# Patient Record
Sex: Male | Born: 1940 | Race: White | Hispanic: No | Marital: Married
Health system: Southern US, Community
[De-identification: ages and names within clinical notes are randomized; demographics above are authoritative.]

## PROBLEM LIST (undated history)

## (undated) DIAGNOSIS — E063 Autoimmune thyroiditis: Secondary | ICD-10-CM

## (undated) DIAGNOSIS — E119 Type 2 diabetes mellitus without complications: Secondary | ICD-10-CM

## (undated) DIAGNOSIS — N400 Enlarged prostate without lower urinary tract symptoms: Secondary | ICD-10-CM

## (undated) HISTORY — PX: CORONARY ANGIOPLASTY WITH STENT PLACEMENT: SHX49

---

## 2017-09-16 IMAGING — CT CT ABDOMEN AND PELVIS WITHOUT CONTRAST
2 of 3 series · 15 of 46 positions shown, 17 images · non-contrast
Comparison: Reportedly recent normal abdominal ultrasound at [facility]

CT ABDOMEN AND PELVIS WITHOUT CONTRAST, 09/16/2017 [DATE]: 
CLINICAL INDICATION:  76-year-old male with elevated liver function tests, 
abnormal blood work, chest pain evaluated in the emergency room, some 
complaining of upper abdominal pain, previous cholecystectomy, umbilical 
hernia 
repair, hydrocele repair, cardiac stent, bilateral inguinal hernia repair 
A search for DICOM formatted images was conducted for prior CT imaging studies 
completed at a non-affiliated media free facility.
TECHNIQUE: The abdomen and pelvis were scanned from lung bases through the 
pubic rami without contrast on a high-resolution CT scanner using dose 
reduction 
techniques..  Routine MPR reconstructions were performed. 
Patient does have a shellfish allergy, no contrast was utilized

[Series 2: abd/pel ax wo · axial · 0.69mm/px · z∈[-615,-210]mm · 12 of 157 slices shown, 14 images]
[im 11/157  soft-tissue]
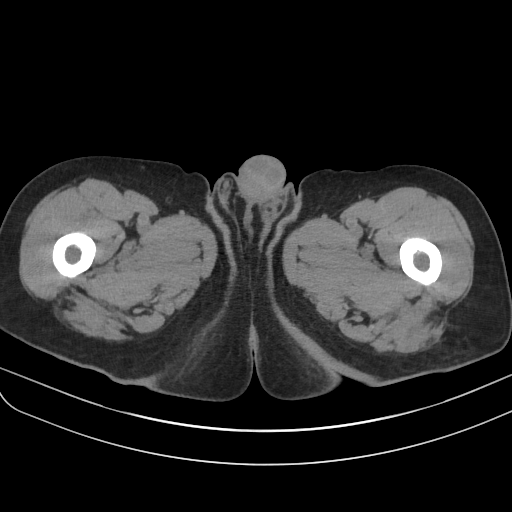
[im 11/157  bone]
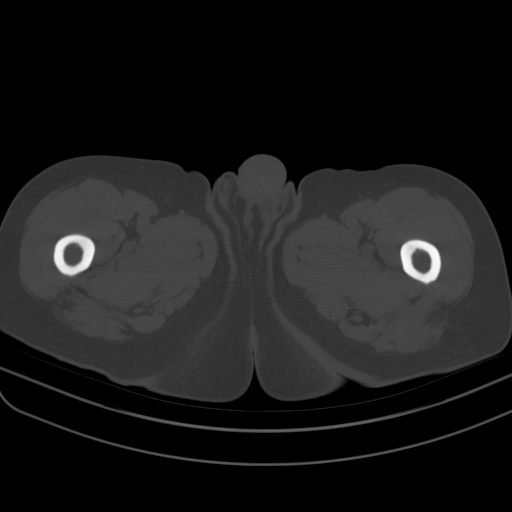
[im 21/157  soft-tissue]
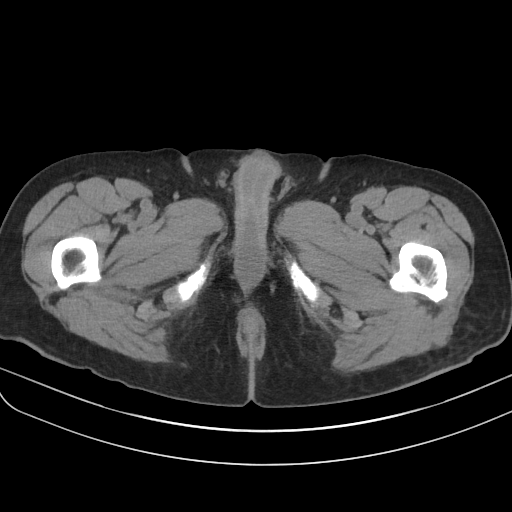
[im 36/157  soft-tissue]
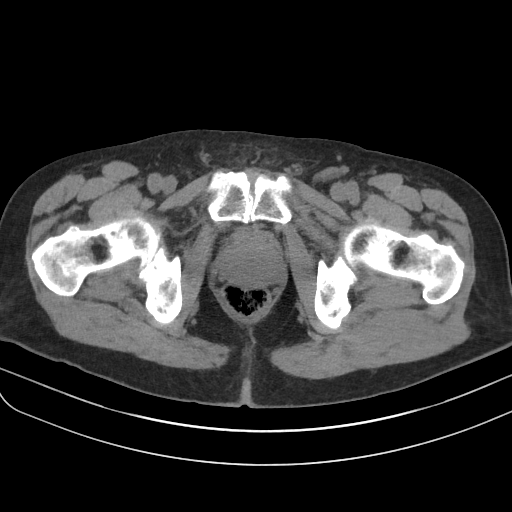
[im 46/157  soft-tissue]
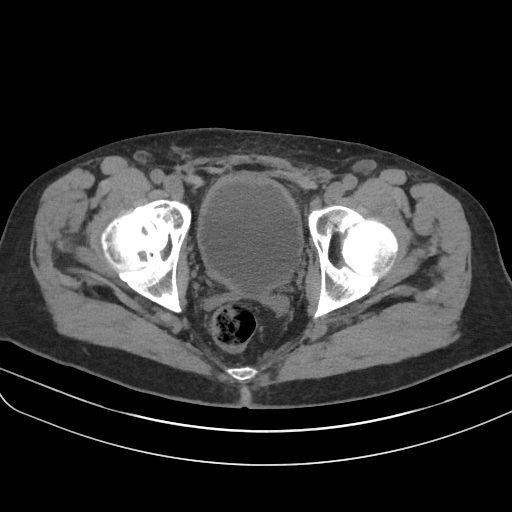
[im 61/157  soft-tissue]
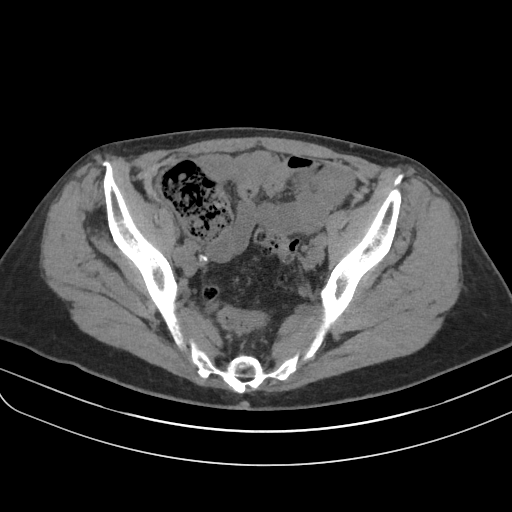
[im 71/157  soft-tissue]
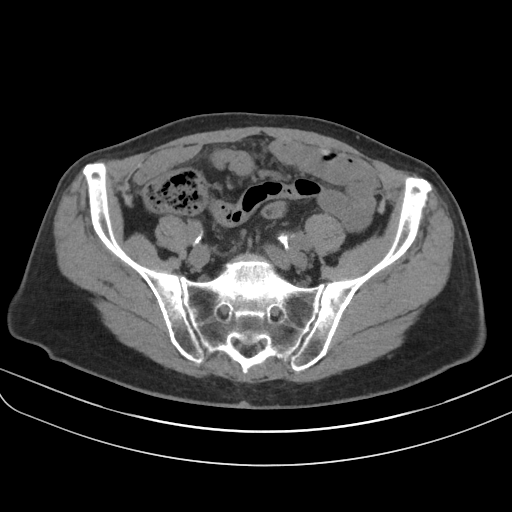
[im 86/157  soft-tissue]
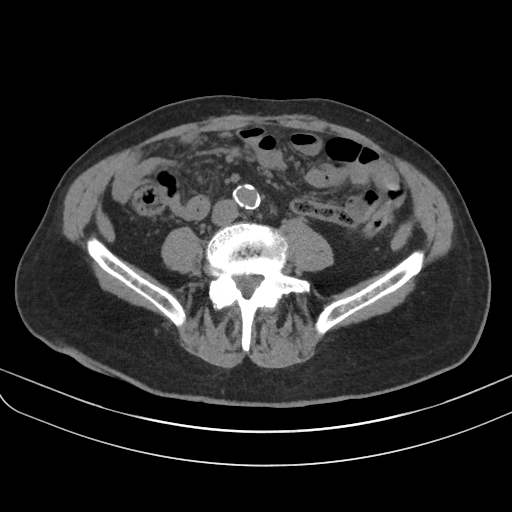
[im 96/157  soft-tissue]
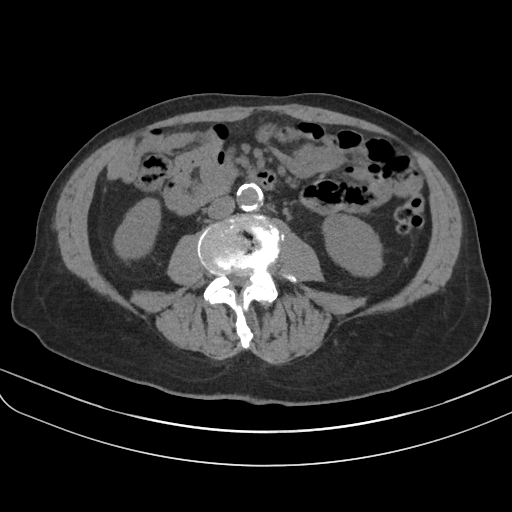
[im 111/157  soft-tissue]
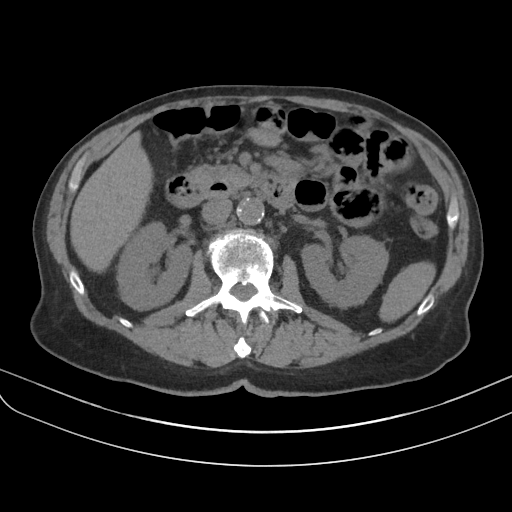
[im 111/157  bone]
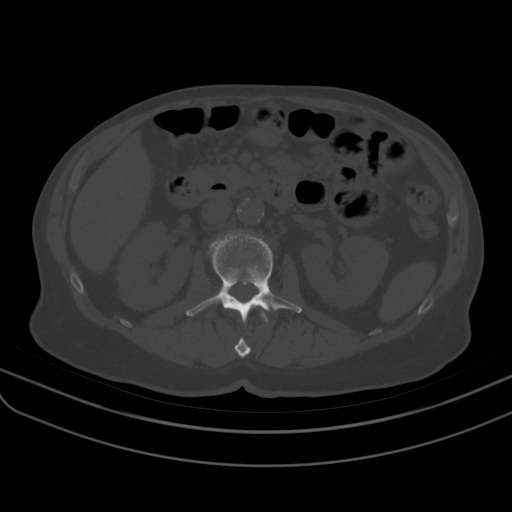
[im 121/157  soft-tissue]
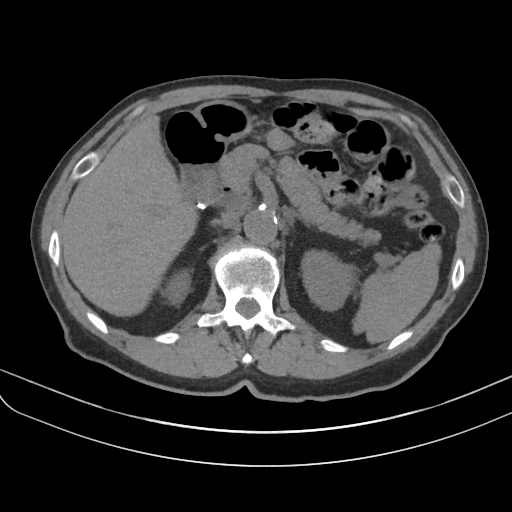
[im 136/157  soft-tissue]
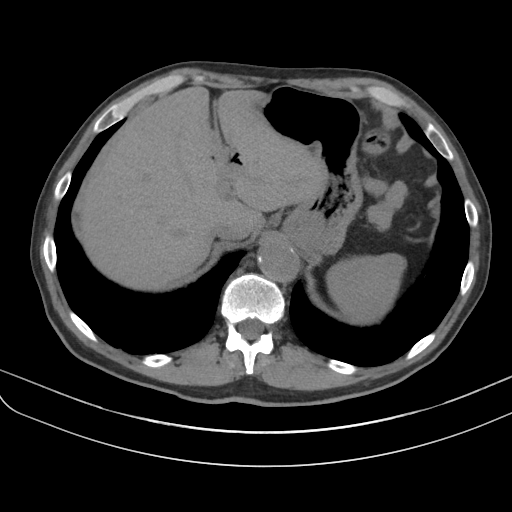
[im 146/157  soft-tissue]
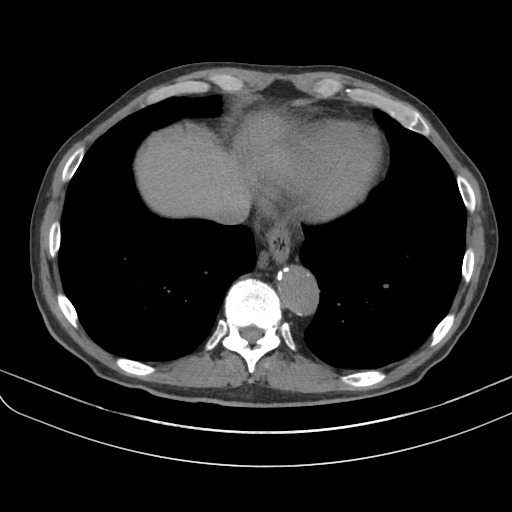

[Series 3: abd/pel cor wo · coronal · 0.71mm/px · 3 of 111 slices shown]
[im 37/111  soft-tissue]
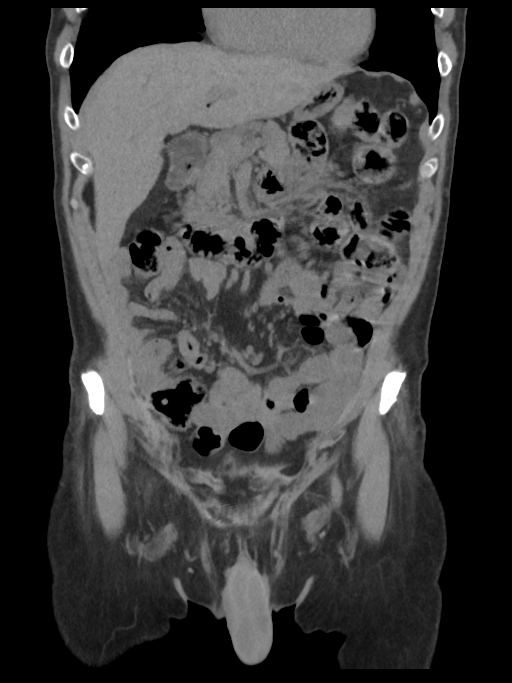
[im 49/111  soft-tissue]
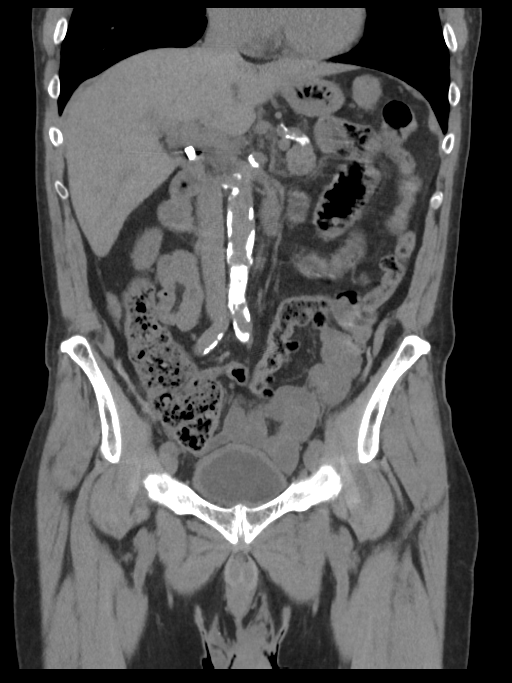
[im 62/111  soft-tissue]
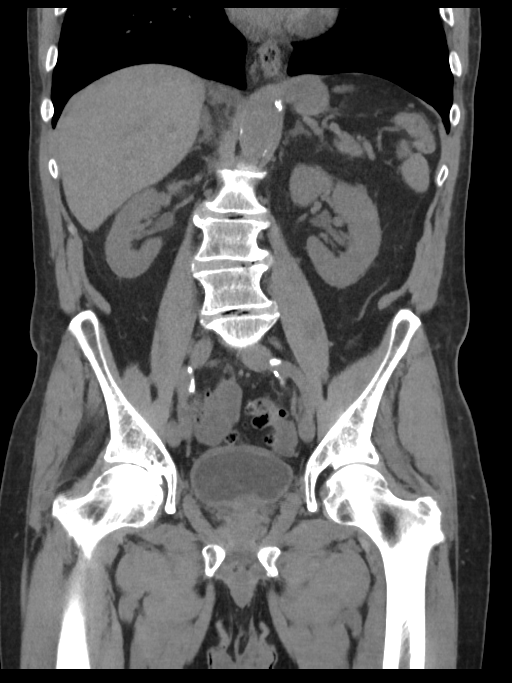

[15 of 46 positions shown; findings below may reference images not displayed]

FINDINGS: There is mild bronchial wall thickening in the lower lobes. No 
infiltrate or nodule. 
Small hiatal hernia. 
Previous cholecystectomy with intrahepatic biliary air primarily in the left 
lobe. 
Posterior segment right lobe liver subcapsular 2.2 x 1.9 cm hypodensity axial 
series 2 image 19 incompletely characterized. 
Liver contour is unremarkable. Liver is not enlarged. Density is normal. 
Normal appearance of the unenhanced spleen, pancreas, bilateral adrenal 
glands, 
and bilateral kidneys. 
Aorta is heavily atherosclerotic but not aneurysmal. 
Stomach is moderately distended with air. 
Small bowel pattern is nonobstructive. 
Moderate stool in the cecum. 
Urinary bladder is thick-walled. Prostate is enlarged and elevates the bladder 
base, measuring at least 5.1 cm craniocaudally. 
There is diffuse stool in the rectosigmoid with rectosigmoid diverticulosis, 
no 
definite CT evidence for diverticulitis. 
Small retroperitoneal lymph nodes are present. 
There are also small inguinal lymph nodes. 
Advanced right hip osteoarthritis with collar osteophyte formation. 
Dextroscoliosis lumbosacral spine. Multilevel vacuum discs. 
Retrolisthesis L3 on L4. No compression fracture. 
Mildly prominent cisterna chyli right T12.
IMPRESSION: Exam limited due to lack of IV contrast. Patient has shellfish allergy. 
Surgically absent gallbladder. Intrahepatic and intraductal biliary air. 
Distal 
ductal debris versus collapsed mucosa. 
Normal size and density of the liver. 
t lobe liver subcapsular dome and 2.2 x 1.9 cm hypodense lesion incompletely 
characterized. Recommend either premedicating for contrast allergy or 
performing 
MRI liver. 
Atherosclerosis and degenerative changes. 
Thick-walled urinary bladder with enlarged prostate. 
RADIATION DOSE REDUCTION: All CT scans are performed using radiation dose 
reduction techniques, when applicable.  Technical factors are evaluated and 
adjusted to ensure appropriate moderation of exposure.  Automated dose 
management technology is applied to adjust the radiation doses to minimize 
exposure while achieving diagnostic quality images.

## 2019-09-19 IMAGING — MR MRI ABDOMEN W/WO CONTRAST WITH MRCP
17 of 24 series · 27 of 48 positions shown · non-contrast
Comparison: CT abdomen and pelvis 09/16/2017 greater than 12 months ago.

MRI ABDOMEN W/WO CONTRAST WITH MRCP, 09/19/2019 [DATE]: 
CLINICAL INDICATION:  Unexplained abdominal pain and nausea. Looking for 
potential choledocholithiasis.
TECHNIQUE: Multiplanar, multiecho position MR images of the abdomen were 
performed with and without intravenous contrast enhancement.  7.5 cc Gadovist 
injected at a rate of 1.5 cc/s intravenously.

[Series 201: survey bh navi · axial · 15.0mm · 1.76mm/px · 1 of 15 slices shown]
[im 1/15]
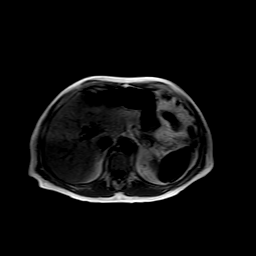

[Series 301: T2 · coronal · 5.0mm · 0.88mm/px · 1 of 32 slices shown]
[im 1/32]
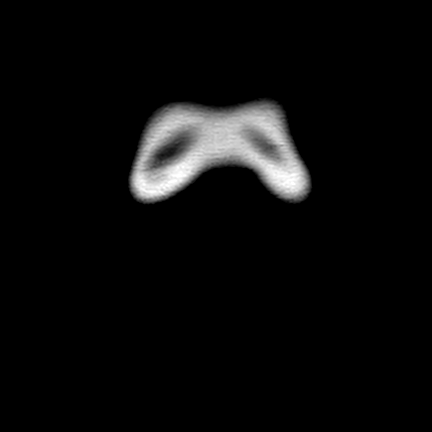

[Series 402: sout of phase · axial · 6.0mm · 1.19mm/px · 1 of 34 slices shown]
[im 1/34]
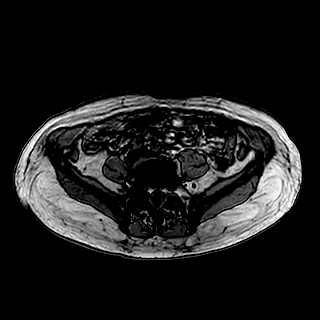

[Series 403: sin phase · axial · 6.0mm · 1.19mm/px · 1 of 34 slices shown]
[im 1/34]
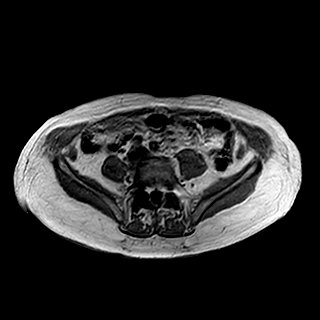

[Series 501: t2_ax_mvxd_hr_rt · axial · 6.0mm · 0.88mm/px · 1 of 40 slices shown]
[im 1/40]
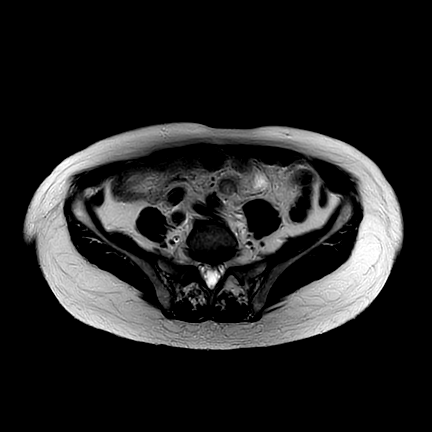

[Series 601: t2_spair_ax_mvxd_hr (panc)_rt · axial · 3.5mm · 0.88mm/px · 1 of 40 slices shown]
[im 1/40]
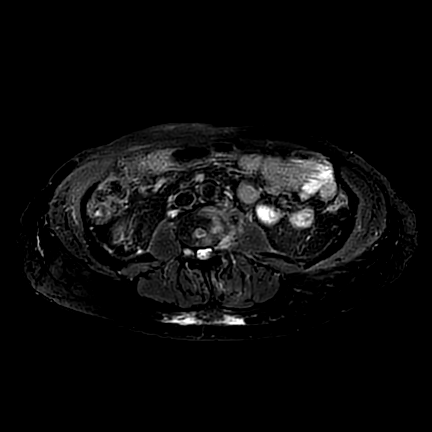

[Series 901: ssh_mrcp rad · coronal · 40.0mm · 0.59mm/px · 1 of 6 slices shown]
[im 1/6]
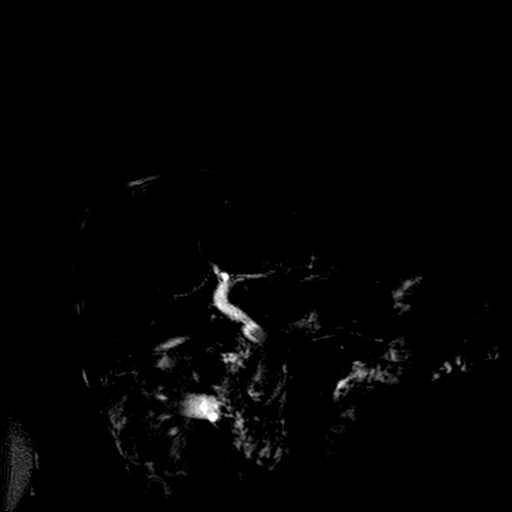

[Series 1002: dadc 600 · axial · 5.0mm · 1.79mm/px · 1 of 42 slices shown]
[im 1/42]
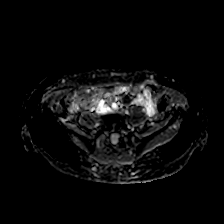

[Series 1003: eadc 600 · axial · 5.0mm · 1.79mm/px · 1 of 39 slices shown]
[im 1/39]
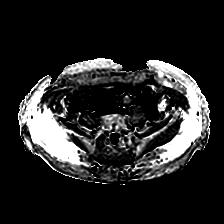

[Series 1004: sbo · axial · 5.0mm · 1.79mm/px · 1 of 45 slices shown]
[im 1/45]
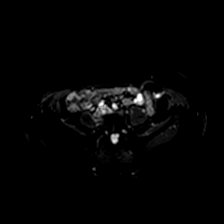

[Series 1005: (id) · axial · 5.0mm · 1.79mm/px · 1 of 45 slices shown]
[im 1/45]
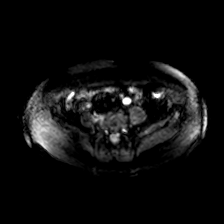

[Series 1101: 3d_grase_bh · coronal · 2.4mm · 0.78mm/px · 1 of 64 slices shown]
[im 1/64]
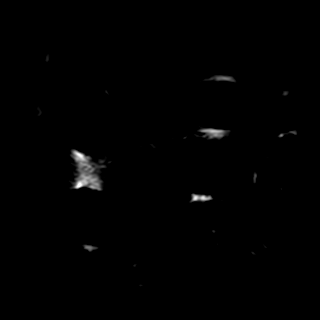

[Series 1202: DIXON · axial · 3.5mm · 0.75mm/px · z∈[-77,+170]mm · 3 of 142 slices shown (1 of 5)]
[im 1/142]
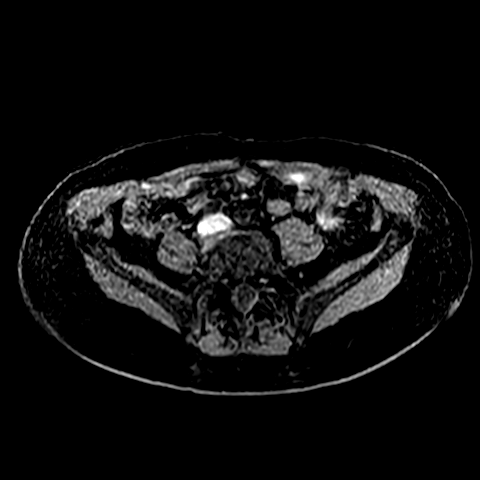
[im 71/142]
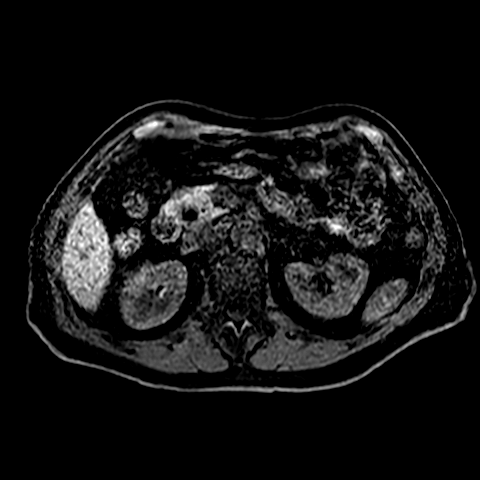
[im 142/142]
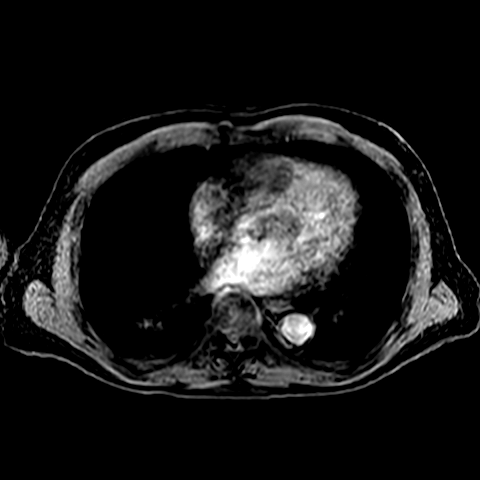

[Series 1203: DIXON · axial · 3.5mm · 0.75mm/px · z∈[-77,+170]mm · 3 of 142 slices shown (2 of 5)]
[im 1/142]
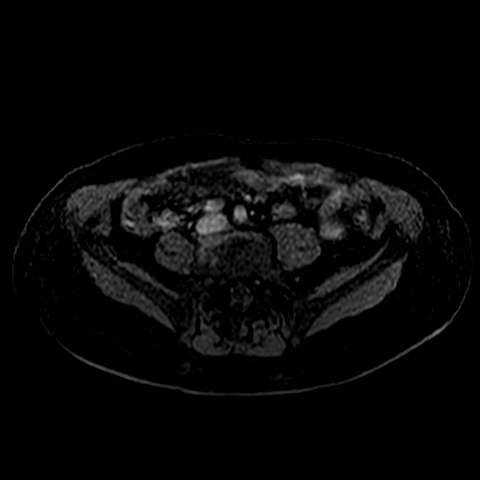
[im 71/142]
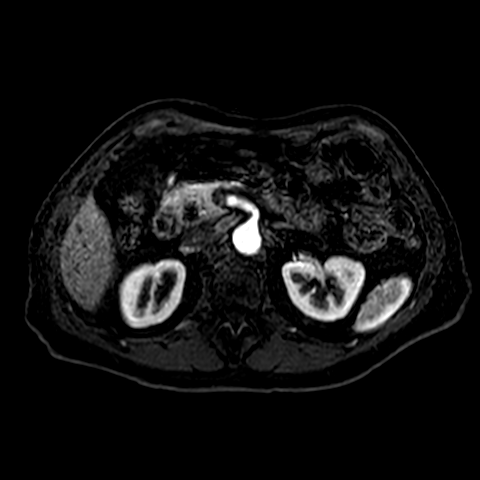
[im 142/142]
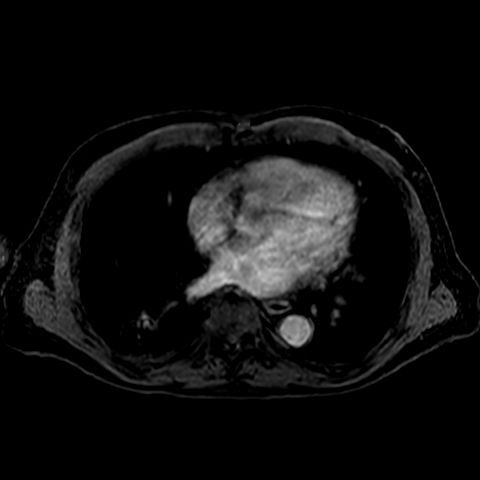

[Series 1204: DIXON · axial · 3.5mm · 0.75mm/px · z∈[-77,+170]mm · 3 of 142 slices shown (3 of 5)]
[im 1/142]
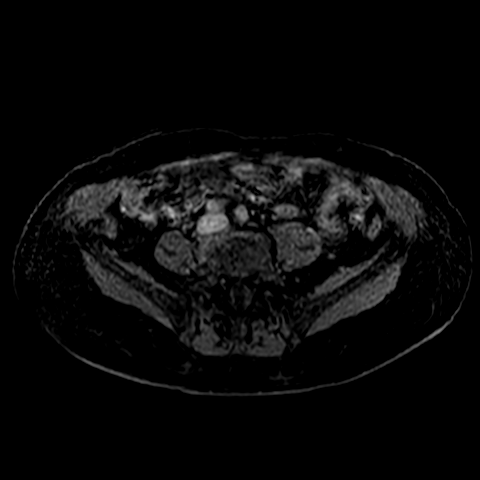
[im 71/142]
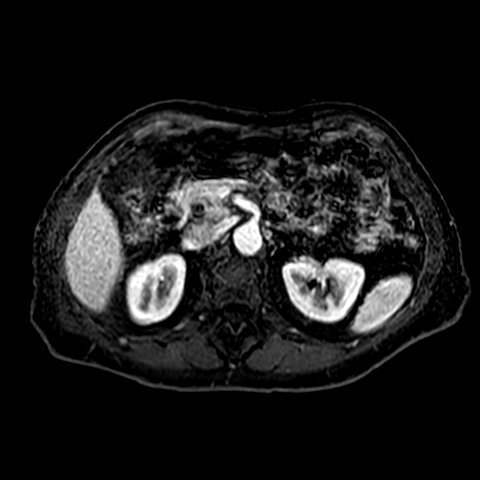
[im 142/142]
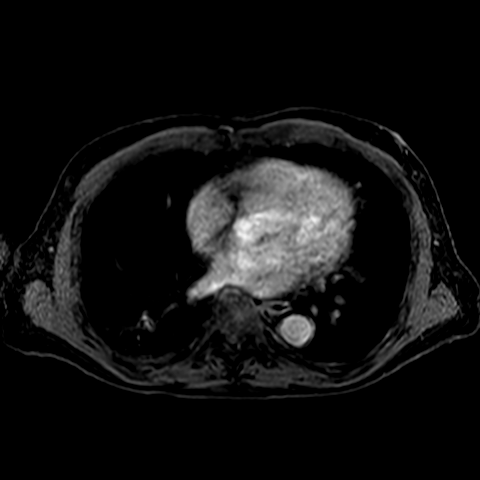

[Series 1205: DIXON · axial · 3.5mm · 0.75mm/px · z∈[-77,+170]mm · 3 of 142 slices shown (4 of 5)]
[im 1/142]
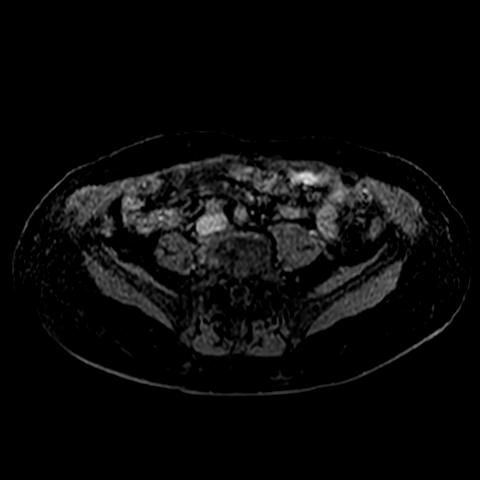
[im 71/142]
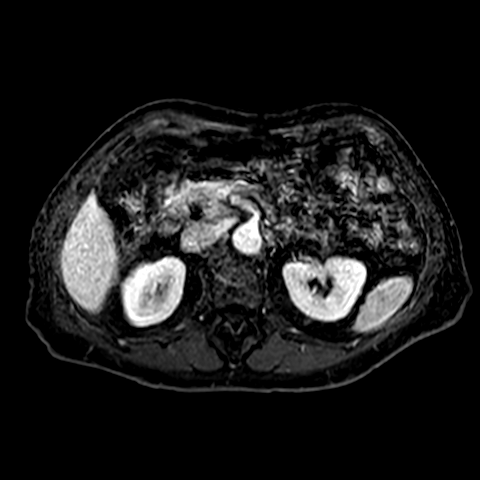
[im 142/142]
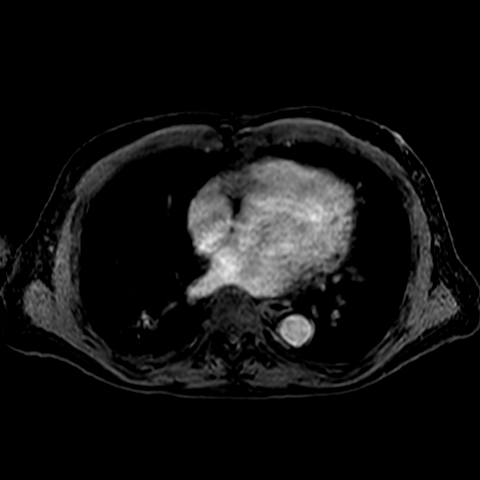

[Series 1206: DIXON · axial · 3.5mm · 0.75mm/px · z∈[-77,+170]mm · 3 of 142 slices shown (5 of 5)]
[im 1/142]
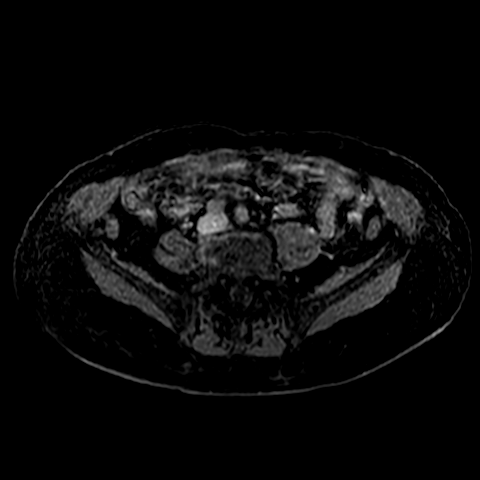
[im 71/142]
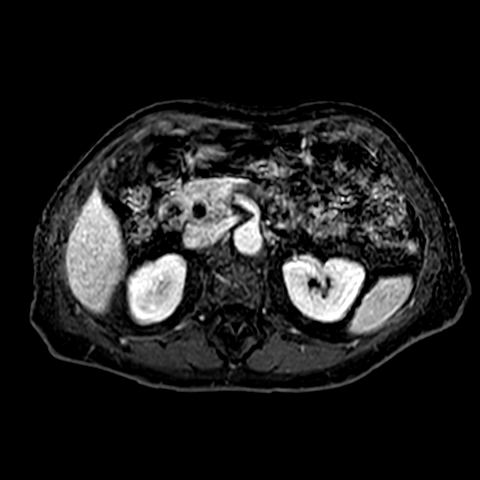
[im 142/142]
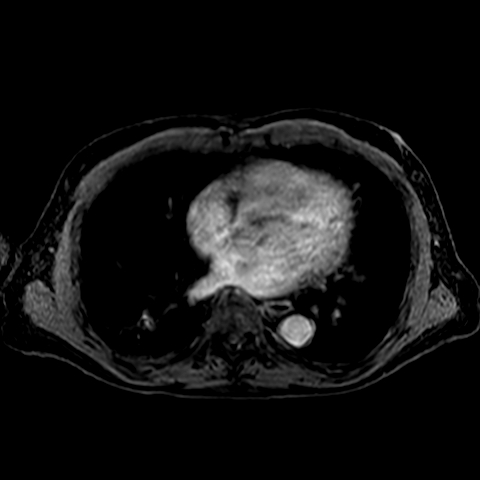

[27 of 48 positions shown; findings below may reference images not displayed]

FINDINGS: There is a 2 cm hemangioma in hepatic segment seven. Potentially another faint 
hemangioma may be present in hepatic segment six. No other significant hepatic 
pathology. 
Gallbladder is not visible and is presumed to be surgically absent. There is a 
large amount of pneumobilia mostly in the extrahepatic biliary tract obscuring 
ideal evaluation. No significant dilatation of the biliary tract. No definite 
choledocholithiasis is found. 
Pancreas and pancreatic duct appear normal. The spleen is within normal limits. 
Portal vein and hepatic veins are patent. 
No concerning abnormalities on diffusion sequences. 
No significant abnormality of the adrenal glands or kidneys. Degenerative 
changes are in the spine. There is mild dextroscoliotic curvature. Abdominal 
aorta is normal in caliber. No significant incidental findings.
IMPRESSION: 1. Because of a large amount of pneumobilia obscuring most of the extrahepatic 
biliary tract the sensitivity for choledocholithiasis is diminished. No 
choledocholithiasis is evident. 
2. There is a hemangioma in hepatic segment seven.

## 2021-12-06 ENCOUNTER — Encounter: Payer: Self-pay | Admitting: Podiatry

## 2021-12-06 ENCOUNTER — Ambulatory Visit: Payer: Medicare Other | Admitting: Podiatry

## 2021-12-06 ENCOUNTER — Other Ambulatory Visit: Payer: Self-pay

## 2021-12-06 DIAGNOSIS — B351 Tinea unguium: Secondary | ICD-10-CM

## 2021-12-06 MED ORDER — CICLOPIROX 8 % EX SOLN
Freq: Every day | CUTANEOUS | 0 refills | Status: AC
Start: 2021-12-06 — End: ?

## 2021-12-06 NOTE — Progress Notes (Signed)
?  Subjective:  ?Patient ID: Clinton Kaufman, male    DOB: October 05, 1940,   MRN: [id number] ? ?Chief Complaint  ?Patient presents with  ? Nail Problem  ?  Patient states left great toe became infected in December , this pass Saturday patient states he went to urgent care they told him to follow up with a podiatrist   ? ? ?81 y.o. male presents for concern of left great toe infection. He was recently seen in urgent care and given keflex for some cellulitis in the area that seems to have improved.  Relates his right great toe had fungus years ago and was cleared up by penlac. Tried to treat left the same . He has had  the nail cultured in the past for fungus and was negative. He is diabetic and his last A1c was 6.9  . Denies any other pedal complaints. Denies n/v/f/c.  ? ?PCP Dennis Bast MD ? ?History reviewed. No pertinent past medical history. ? ?Objective:  ?Physical Exam: ?Vascular: DP/PT pulses 2/4 bilateral. CFT <3 seconds. Normal hair growth on digits. No edema.  ?Skin. No lacerations or abrasions bilateral feet. Hallux nail thickened and discolored and detached from nail bed laterally.  ?Musculoskeletal: MMT 5/5 bilateral lower extremities in DF, PF, Inversion and Eversion. Deceased ROM in DF of ankle joint.  ?Neurological: Sensation intact to light touch.  ? ?Assessment:  ? ?1. Onychomycosis   ? ? ? ?Plan:  ?Patient was evaluated and treated and all questions answered. ?-Examined patient ?-Discussed treatment options for painful dystrophic nails  ?-Cutlure negative in the past but potentially could have re-infection of the toenail.  ?-Discussed fungal nail treatment options including oral, topical, and laser treatments.  ?-Refill for penlac provided.  ?-Hallux nail debirded without incident and new nail growing in normal in appearance.  ?-Patient to return as needed.  ? ? ?Louann Sjogren, DPM  ? ? ?

## 2021-12-27 ENCOUNTER — Telehealth: Payer: Self-pay | Admitting: Podiatry

## 2021-12-27 MED ORDER — CEPHALEXIN 500 MG PO CAPS: 500.0000 mg | ORAL_CAPSULE | Freq: Three times a day (TID) | ORAL | 0 refills | Status: AC

## 2021-12-27 NOTE — Telephone Encounter (Signed)
Patient called, toe was doing better after last visit until yesterday when it became swollen red and painful. Previously keflex has resolved this. Advised on worsening signs/symptoms and he will see [place] if it persists, keflex sent to pharmacy ?

## 2022-01-16 ENCOUNTER — Ambulatory Visit: Payer: Medicare Other | Admitting: Podiatry

## 2022-01-16 ENCOUNTER — Encounter: Payer: Self-pay | Admitting: Podiatry

## 2022-01-16 DIAGNOSIS — B351 Tinea unguium: Secondary | ICD-10-CM | POA: Diagnosis not present

## 2022-01-16 NOTE — Progress Notes (Signed)
?  Subjective:  ?Patient ID: Clinton Kaufman, male    DOB: 04/21/1941,   MRN: [id number] ? ?Chief Complaint  ?Patient presents with  ? Nail Problem  ?   ? left great toe infection has returned  ? ? ?81 y.o. male presents for reoccurrence of left great to infection. Relates several weeks ago he had redness and swelling in the toe. Was called in a dose of antibiotics and relates it did better however after a few days it came back. He was seen in urgent care and given cefuroxime which is helping and has a few more days. He is diabetic and last A1c from a year ago was 6.9  . Denies any other pedal complaints. Denies n/v/f/c.  ? ?No past medical history on file. ? ?Objective:  ?Physical Exam: ?Vascular: DP/PT pulses 2/4 bilateral. CFT <3 seconds. Normal hair growth on digits. No edema.  ?Skin. No lacerations or abrasions bilateral feet. No signs of infection noted today. No erythema edema or purulence noted. Left hallux nail slightly discolored but no other changes noted.  ?Musculoskeletal: MMT 5/5 bilateral lower extremities in DF, PF, Inversion and Eversion. Deceased ROM in DF of ankle joint.  ?Neurological: Sensation intact to light touch.  ? ?Assessment:  ? ?1. Onychomycosis   ? ? ? ?Plan:  ?Patient was evaluated and treated and all questions answered. ?Toe was evaluated and appears to be healing well.  ?Continue course of antibiotics.  ?Discussed if flares again start epsom salt soaks and neosporin and band aid and come in to be seen.  ?Patient to follow-up as needed.  ? ? ?Louann Sjogren, DPM  ? ? ?

## 2022-06-23 ENCOUNTER — Other Ambulatory Visit: Payer: Self-pay

## 2022-06-23 ENCOUNTER — Emergency Department (HOSPITAL_BASED_OUTPATIENT_CLINIC_OR_DEPARTMENT_OTHER)
Admission: EM | Admit: 2022-06-23 | Discharge: 2022-06-23 | Disposition: A | Discharge status: Home / Self Care | Payer: Medicare Other | Attending: Emergency Medicine | Admitting: Emergency Medicine

## 2022-06-23 ENCOUNTER — Encounter (HOSPITAL_BASED_OUTPATIENT_CLINIC_OR_DEPARTMENT_OTHER): Payer: Self-pay | Admitting: Pediatrics

## 2022-06-23 DIAGNOSIS — Z794 Long term (current) use of insulin: Secondary | ICD-10-CM | POA: Insufficient documentation

## 2022-06-23 DIAGNOSIS — S01512A Laceration without foreign body of oral cavity, initial encounter: Secondary | ICD-10-CM

## 2022-06-23 DIAGNOSIS — X58XXXA Exposure to other specified factors, initial encounter: Secondary | ICD-10-CM | POA: Diagnosis not present

## 2022-06-23 DIAGNOSIS — S0993XA Unspecified injury of face, initial encounter: Secondary | ICD-10-CM | POA: Diagnosis present

## 2022-06-23 HISTORY — DX: Type 2 diabetes mellitus without complications: E11.9

## 2022-06-23 HISTORY — DX: Autoimmune thyroiditis: E06.3

## 2022-06-23 HISTORY — DX: Benign prostatic hyperplasia without lower urinary tract symptoms: N40.0

## 2022-06-23 MED ORDER — SILVER NITRATE-POT NITRATE 75-25 % EX MISC
CUTANEOUS | Status: AC
  Filled 2022-06-23: qty 10

## 2022-06-23 NOTE — ED Provider Notes (Signed)
[facility]ERGENC[facility] DEPARTMENT Provider Note   CSN: [id number] Arrival date & time: 06/23/22  1034     History  Chief Complaint  Patient presents with   Lip Laceration    Clinton Kaufman is a 81 y.o. male.  Who presents ED for evaluation of tongue laceration.  Patient states that on Thursday he sneezed so hard that he bit down on his tongue.  Bleeding was initially controlled with direct pressure.  He presented to urgent care yesterday for continued seeping blood from the wound.  Wound was cauterized with silver nitrate.  He then states he continued to have seeping blood from the wound intermittently throughout the day.  He has been the affected area multiple times while eating since initial injury and states this makes the area bleed as well.  Reports minimal bleeding when it does occur, however he is traveling by airplane this Thursday and does not want to deal with continued bleeding while in the airport.  Denies difficulty breathing, decreased oral intake, numbness, tingling.  States pain is minimal.  HPI     Home Medications Prior to Admission medications   Medication Sig Start Date End Date Taking? Authorizing Provider  B-D ULTRA-FINE 33 LANCETS MISC USE TO TEST BLOOD SUGAR ONCE DAILY 01/21/17   [provider]  Calcium Citrate-Vitamin D 315-5 MG-MCG TABS Take by mouth.    [provider]  cephALEXin (KEFLEX) 500 MG capsule Take 1 capsule (500 mg total) by mouth 3 (three) times daily. 12/27/21   McDonald, Stephan Minister, DPM  ciclopirox (PENLAC) 8 % solution Apply topically at bedtime. Apply over nail and surrounding skin. Apply daily over previous coat. After seven (7) days, may remove with alcohol and continue cycle. 12/06/21   Lorenda Peck, DPM  diclofenac (VOLTAREN) 75 MG EC tablet Take 75 mg by mouth 2 (two) times daily. 07/16/21   [provider]  EPINEPHrine 0.3 mg/0.3 mL IJ SOAJ injection epinephrine 0.3 mg/0.3 mL injection, auto-injector 02/24/17    [provider]  glucose blood test strip OneTouch Ultra Test strips    [provider]  glucose blood test strip OneTouch Ultra Blue Test Strip    [provider]  HUMALOG KWIKPEN 100 UNIT/ML KwikPen Inject into the skin. 08/30/21   [provider]  ibuprofen (ADVIL) 100 MG tablet Take by mouth.    [provider]  Lancets (ONETOUCH DELICA PLUS [id number]) [place] Apply topically. 06/12/21   [provider]  levothyroxine (SYNTHROID) 125 MCG tablet Take by mouth.    [provider]  losartan (COZAAR) 100 MG tablet losartan 100 mg tablet    [provider]  meloxicam (MOBIC) 15 MG tablet meloxicam 15 mg tablet    [provider]  nebivolol (BYSTOLIC) 5 MG tablet Take by mouth.    [provider]  nitroGLYCERIN (NITROSTAT) 0.4 MG SL tablet Place under the tongue. 06/15/19   [provider]  nystatin (MYCOSTATIN) 100000 UNIT/ML suspension Take by mouth. 09/25/21   [provider]  OneTouch Delica Lancets 93O MISC USE TO TEST BLOOD SUGAR ONCE DAILY 01/21/17   [provider]  pravastatin (PRAVACHOL) 40 MG tablet 0.5 tablets (20 mg total) daily. 05/02/15   [provider]  sitaGLIPtin (JANUVIA) 100 MG tablet Januvia 100 mg tablet    [provider]  tamsulosin (FLOMAX) 0.4 MG CAPS capsule Take by mouth.    [provider]  tiZANidine (ZANAFLEX) 2 MG tablet Take by mouth. 03/06/21   [provider]  Zoster Vaccine Adjuvanted [facility]) injection Shingrix (PF) 50 mcg/0.5 mL intramuscular suspension, kit    [provider]      Allergies    Iodine, Shellfish allergy, Sulfa antibiotics, Methocarbamol, Other, and Indomethacin    Review of Systems   Review of Systems  HENT:  Positive for mouth sores.   All other systems reviewed and are negative.   Physical Exam Updated Vital Signs BP (!) 172/92 (BP Location: Left Arm)   Pulse 61   Temp 98.2 F  (36.8 C) (Oral)   Resp 18   Ht 5' 6"  (1.676 m)   Wt 60.3 kg   SpO2 98%   BMI 21.47 kg/m  Physical Exam Vitals and nursing note reviewed.  Constitutional:      General: He is not in acute distress.    Appearance: Normal appearance. He is normal weight. He is not ill-appearing.  HENT:     Head: Normocephalic and atraumatic.     Jaw: No trismus, swelling or pain on movement.     Mouth/Throat:     Lips: Pink.     Mouth: Mucous membranes are moist.     Tongue: Lesions (Small ulcer on the left lower portion of the tongue.  2 to 3 mm.  No active bleeding) present.     Pharynx: Oropharynx is clear. Uvula midline. No pharyngeal swelling or posterior oropharyngeal erythema.  Pulmonary:     Effort: Pulmonary effort is normal. No respiratory distress.  Abdominal:     General: Abdomen is flat.  Musculoskeletal:        General: Normal range of motion.     Cervical back: Neck supple.  Skin:    General: Skin is warm and dry.  Neurological:     Mental Status: He is alert and oriented to person, place, and time.  Psychiatric:        Mood and Affect: Mood normal.        Behavior: Behavior normal.     ED Results / Procedures / Treatments   Labs (all labs ordered are listed, but only abnormal results are displayed) Labs Reviewed - No data to display  EKG None  Radiology No results found.  Procedures Procedures    Medications Ordered in ED Medications  silver nitrate applicators 48-01 % applicator (  Given by Other 06/23/22 1244)    ED Course/ Medical Decision Making/ A&P Clinical Course as of 06/23/22 1246  Mon Jun 24, 6927  5760 81 year old male here with lesion on his tongue after he bit it that continues to bleed.  He is otherwise in no distress not on blood thinners.  Area was cauterized.  No active bleeding at this time.  Return instructions discussed [MB]    Clinical Course User Index [MB] Hayden Rasmussen, MD                           Medical Decision Making This  patient presents to the ED for concern of tongue laceration  Additional history obtained from: Nursing notes from this visit. Previous records within EMR system urgent care visit yesterday.  Cauterized with silver nitrate.  81 year old male presents ED for evaluation of tongue laceration.  Was cauterized yesterday in urgent care, however he states that he still feels blood seeping from his tongue intermittently.  They are traveling by plane soon and would like the area To be recauterized to make sure does not bleed anymore.  Physical exam shows  small ulcerated lesion of the left lower portion of the tongue from where he bit it.  No obvious bleeding and lesion already showing signs of healing.  Area was cauterized again at patient's request with silver nitrate.  Patient was informed that he may stop antibiotics and ice to the area to reduce swelling.  He was instructed to be mindful while chewing in order to prevent reinjury.  Given return precautions.  Stable at discharge.  At this time there does not appear to be any evidence of an acute emergency medical condition and the patient appears stable for discharge with appropriate outpatient follow up. Diagnosis was discussed with patient who verbalizes understanding of care plan and is agreeable to discharge. I have discussed return precautions with patient who verbalizes understanding. Patient encouraged to follow-up with their PCP within 1 week. All questions answered.  Patient's case discussed with Dr. Melina Copa who agrees with plan to discharge with follow-up.   Note: Portions of this report may have been transcribed using voice recognition software. Every effort was made to ensure accuracy; however, inadvertent computerized transcription errors may still be present.          Final Clinical Impression(s) / ED Diagnoses Final diagnoses:  Laceration of tongue, initial encounter    Rx / DC Orders ED Discharge Orders     None          Roylene Reason, Hershal Coria 06/23/22 1257    Hayden Rasmussen, MD 06/23/22 [phone]

## 2022-06-23 NOTE — ED Notes (Signed)
PT DOESN'T WANT VITALS TAKEN, STATES HE IS JUST HERE TO GET HIS TONGUE CHECKED

## 2022-06-23 NOTE — Discharge Instructions (Addendum)
You have been seen today for your complaint of tongue laceration. Home care instructions are as follows:  You should apply ice to the affected area throughout the day and prior to eating in order to avoid reinjuring the tongue.  You may also take ibuprofen for its anti-inflammatory effects. Follow up with: Your primary care provider in 1 week Please seek immediate medical care if you develop any of the following symptoms: The edges of your wound break open. Your face or the area under your jaw becomes swollen. You have trouble breathing or swallowing. At this time there does not appear to be the presence of an emergent medical condition, however there is always the potential for conditions to change. Please read and follow the below instructions.  Do not take your medicine if  develop an itchy rash, swelling in your mouth or lips, or difficulty breathing; call 911 and seek immediate emergency medical attention if this occurs.  You may review your lab tests and imaging results in their entirety on your MyChart account.  Please discuss all results of fully with your primary care provider and other specialist at your follow-up visit.  Note: Portions of this text may have been transcribed using voice recognition software. Every effort was made to ensure accuracy; however, inadvertent computerized transcription errors may still be present.

## 2022-06-23 NOTE — ED Triage Notes (Addendum)
Stated bit tongue on Thursday and was seen at Urgent care yesterday and silver nitrate was applied, and it still bleeding. Stated he is on ASA daily.

## 2022-12-03 ENCOUNTER — Ambulatory Visit (INDEPENDENT_AMBULATORY_CARE_PROVIDER_SITE_OTHER): Payer: Medicare Other | Admitting: Podiatry

## 2022-12-03 DIAGNOSIS — E1342 Other specified diabetes mellitus with diabetic polyneuropathy: Secondary | ICD-10-CM | POA: Diagnosis not present

## 2022-12-03 DIAGNOSIS — L600 Ingrowing nail: Secondary | ICD-10-CM | POA: Diagnosis not present

## 2022-12-03 MED ORDER — LIDOCAINE-PRILOCAINE 2.5-2.5 % EX CREA: 1.0000 | TOPICAL_CREAM | CUTANEOUS | 3 refills | Status: AC | PRN

## 2022-12-08 ENCOUNTER — Encounter: Payer: Self-pay | Admitting: Podiatry

## 2022-12-08 NOTE — Progress Notes (Signed)
  Subjective:  Patient ID: Clinton Kaufman, male    DOB: 05-15-1941,  MRN: [id number]  Chief Complaint  Patient presents with   Diabetes    est - right great toe - burning, pain sensation moving from nail up the foot - diabetic    82 y.o. male presents with the above complaint. History confirmed with patient.  He notes that his A1c is little bit higher right now about 7.5%  Objective:  Physical Exam: warm, good capillary refill, no trophic changes or ulcerative lesions, normal DP and PT pulses, and slight nail pain ingrowing nail medial border, he has some loss of protective sensation  Assessment:   1. Ingrown nail   2. 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   We discussed that he is having ingrowing nail pain that is likely hypersensitivities due to some diabetic peripheral neuropathy, his A1c is elevated beyond where it usually is.  We discussed the options of partial permanent matricectomy and nail removal.  He would like to wait to see if he can get his A1c better to see if this resolves.  In the meantime can use topical medication such as Emla cream, I sent an Rx for this.  He will let me know if it does not improve or worsens and will return for the procedure when his A1c is improved  Return if symptoms worsen or fail to improve.

## 2023-10-14 IMAGING — MR MRI LEFT TIB-FIB WITH AND WITHOUT CONTRAST
7 of 15 series · 21 of 48 positions shown · IV contrast (Gadolinium)
Comparison: None prior.

________________________________________________________________________________________________ 
MRI LEFT TIB-FIB WITH AND WITHOUT CONTRAST, 10/14/2023 [DATE]: 
CLINICAL INDICATION: Localized Swelling, Mass And Lump, Left Lower limb. 2 
masses on left shin. One has been present for one year and the other for 6 
months. Nonpainful months time. Prior history of skin cancer
TECHNIQUE: Multiplanar, multiecho position MR images of the were performed 
without and with intravenous gadolinium enhancement. 6 mL of Gadavist were 
injected intravenously by hand. 1.5 mL of Gadavist discarded. Patient was 
scanned on a

[Series 301: survey · axial · 10.0mm · 1.12mm/px · z∈[-129,+149]mm · 2 of 9 slices shown]
[im 1/9]
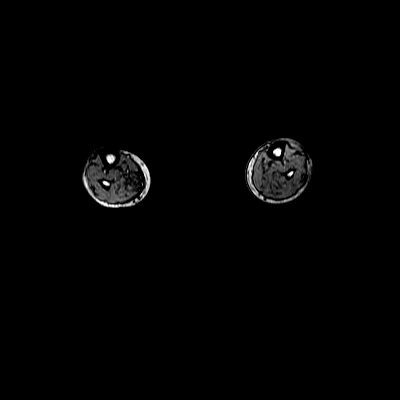
[im 9/9]
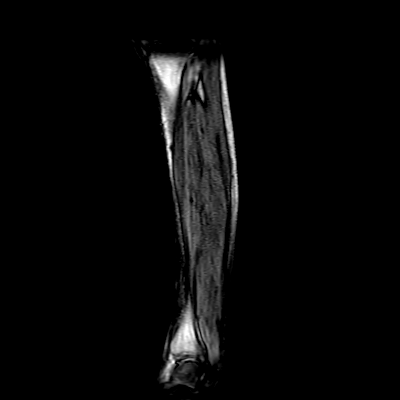

[Series 402: stir_cor bilat · coronal · 4.5mm · 0.53mm/px · 2 of 22 slices shown]
[im 1/22]
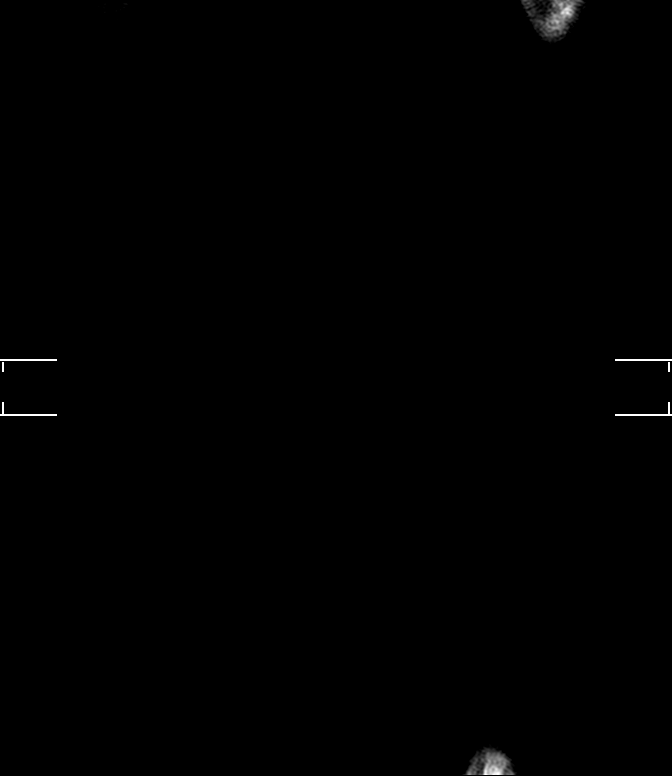
[im 22/22]
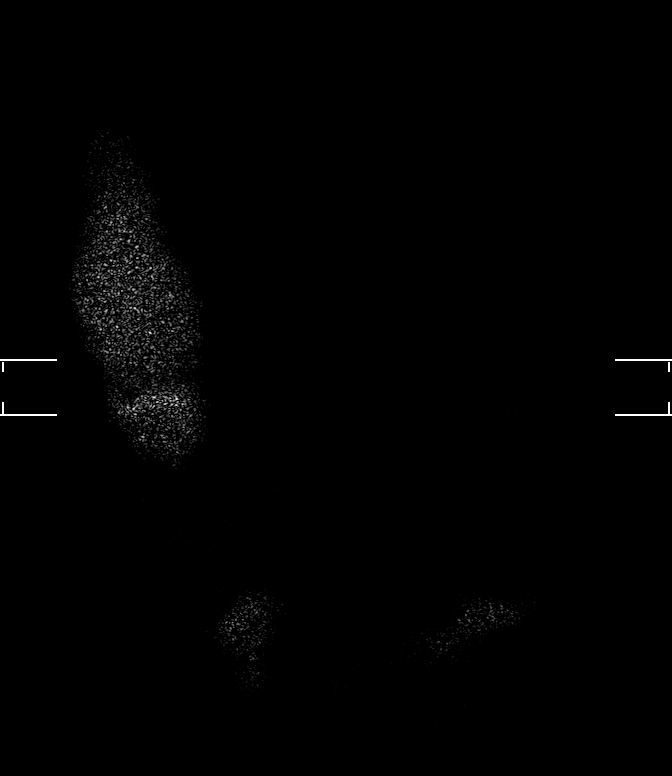

[Series 501: t1_cor · coronal · 4.5mm · 0.56mm/px · 3 of 43 slices shown]
[im 1/43]
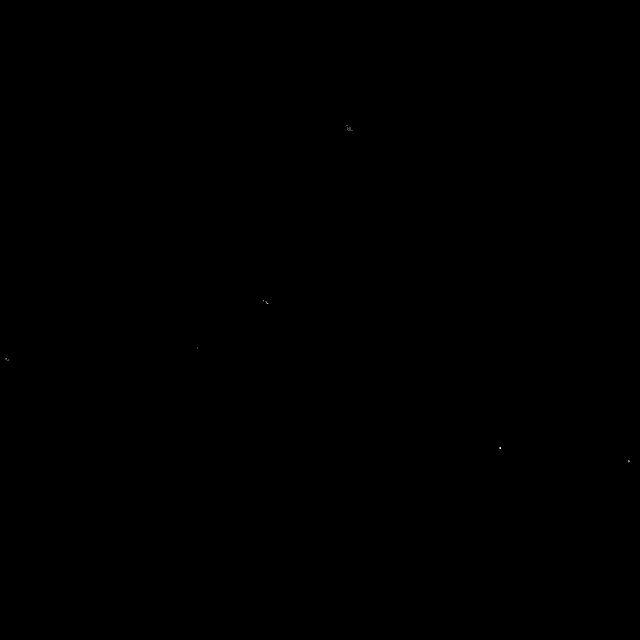
[im 22/43]
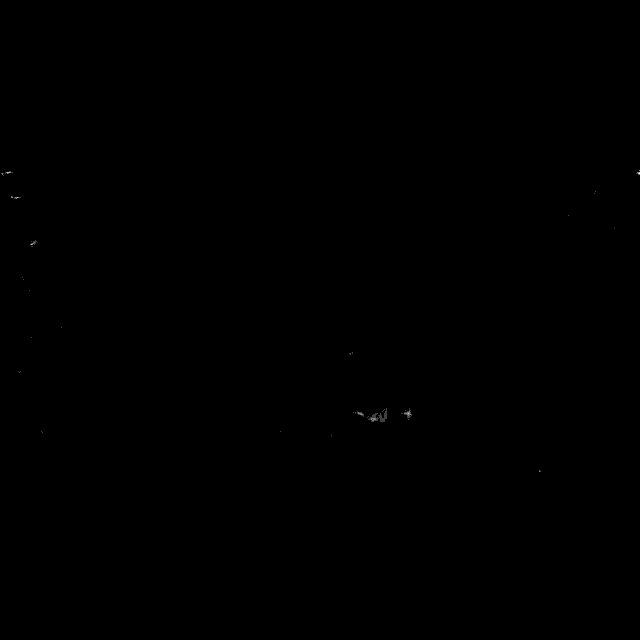
[im 43/43]
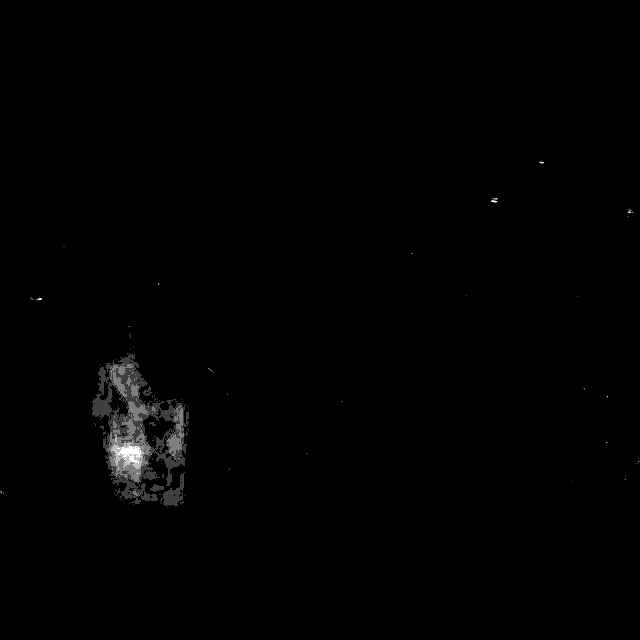

[Series 502: mobiview t1_cor bilat · 2 of 22 slices shown]
[im 1/22]
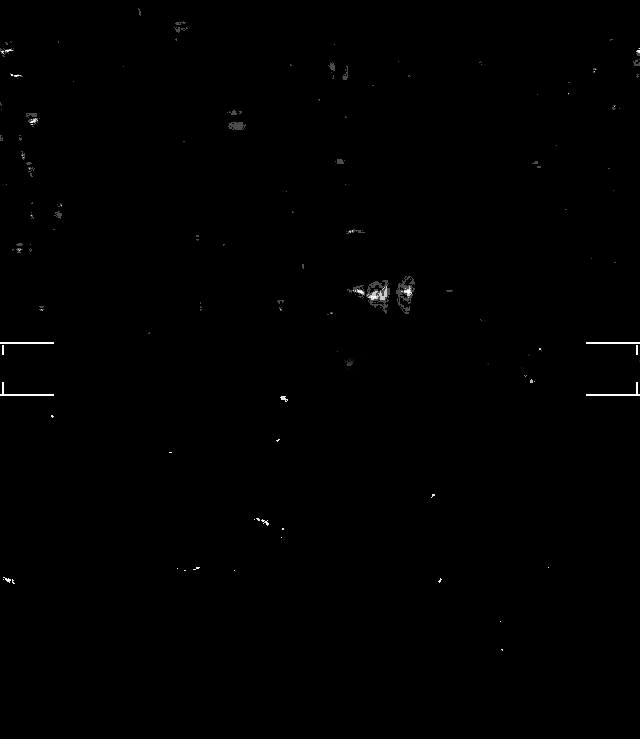
[im 22/22]
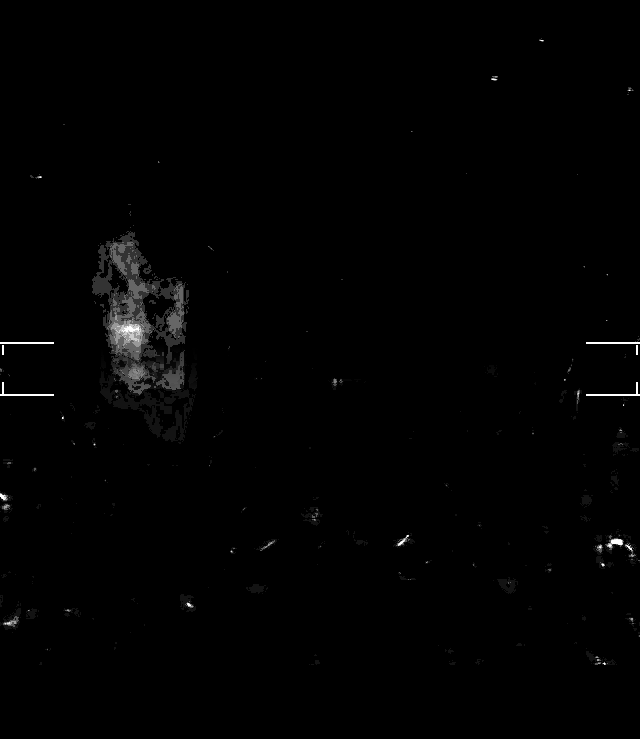

[Series 601: t1w_ax left · axial · left · 5.0mm · 0.46mm/px · z∈[-274,+136]mm · 4 of 60 slices shown]
[im 1/60]
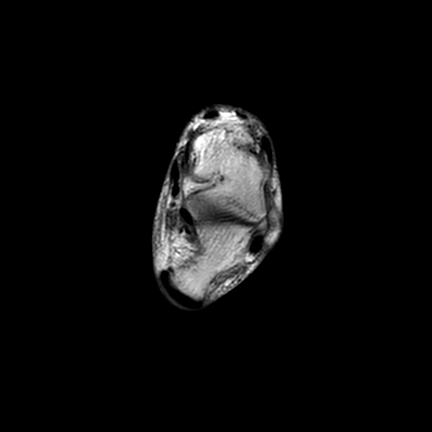
[im 20/60]
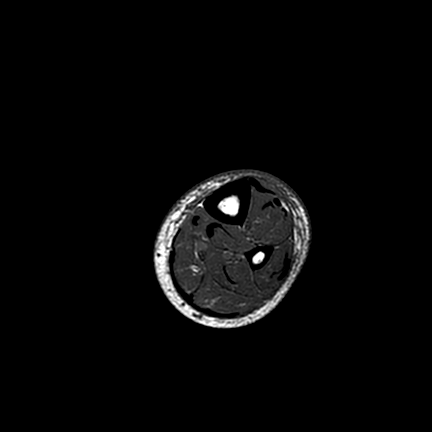
[im 40/60]
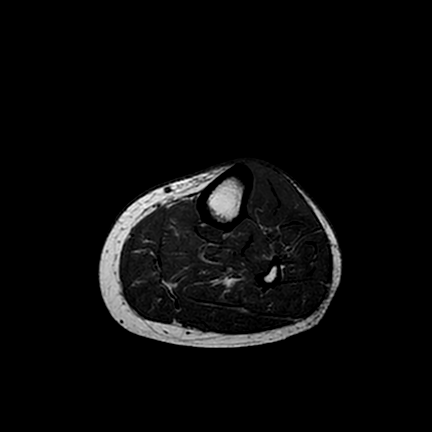
[im 60/60]
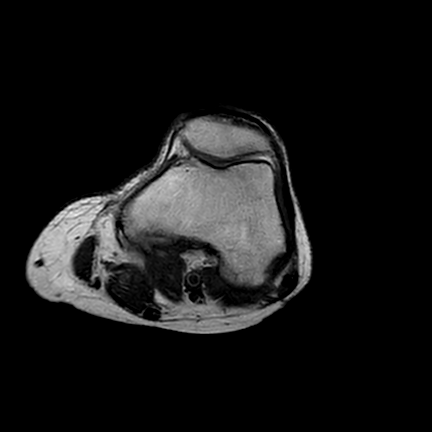

[Series 702: st-2 fs ax · axial · left · 5.0mm · 0.62mm/px · z∈[-274,+136]mm · 4 of 60 slices shown]
[im 1/60]
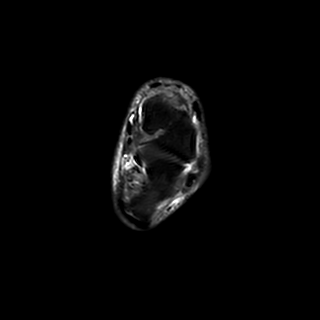
[im 20/60]
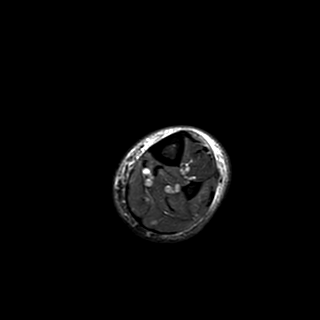
[im 40/60]
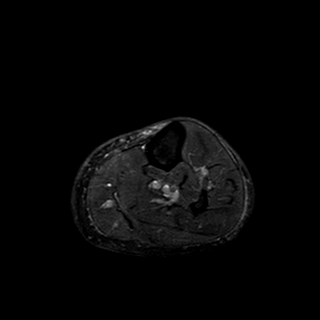
[im 60/60]
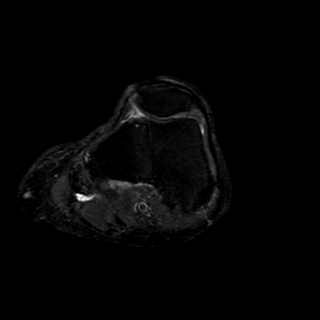

[Series 703: st-2 ax · axial · left · 5.0mm · 0.62mm/px · z∈[-274,+136]mm · 4 of 60 slices shown]
[im 1/60]
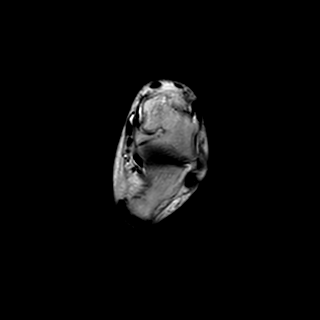
[im 20/60]
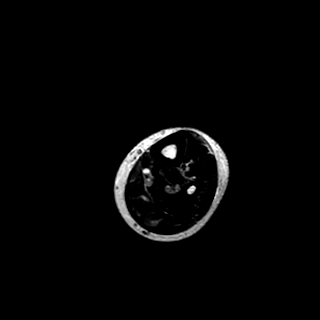
[im 40/60]
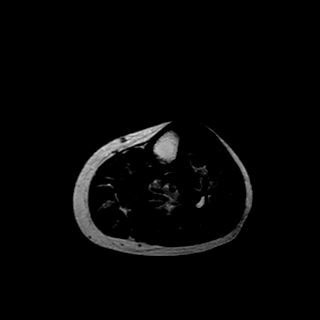
[im 60/60]
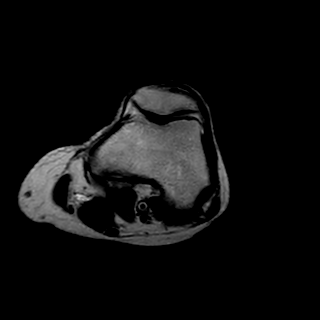

[21 of 48 positions shown; findings below may reference images not displayed]

FINDINGS: There are 2 separate soft tissue masses involving the anterior left lower 
extremity. These masses correlate with areas of palpable concern. These masses 
involve the skin and subcutaneous tissues with T1 signal intensity isointense to 
skeletal muscle, increased T2 signal intensity and heterogeneous enhancement. 
There is surrounding subcutaneous edema. The most proximal mass measures 2.6 cm 
TR by 1 cm AP by 2.2 cm SI. Caudal to this is the second mass measuring 2.6 cm 
TR by 1.2 cm AP by 2.9 cm SI. The more caudal mass extends through the fascia 
into the anterior tibialis by a depth of approximately 3 mm. This is seen for 
example on series 902, image 35. There is surrounding subcutaneous edema. No 
additional soft tissue mass. 
Moderately advanced degenerative changes left knee are partially included lumbar 
signal intensity is negative for focal marrow replacing lesion. Included 
neurovascular bundles are negative.
IMPRESSION: Enhancing multinodular masses involving the skin and subcutaneous tissues of the 
anterior left lower extremity. Appearance is most suggestive for 
dermatofibrosarcoma protuberans. Recommend orthopedic oncology consultation 
prior to biopsy. 
Findings were called to referring service by myself at the time of this 
dictation.
